# Patient Record
Sex: Male | Born: 1945 | Race: White | Hispanic: No | Marital: Married | Smoking: Current every day smoker
Health system: Southern US, Community
[De-identification: ages and names within clinical notes are randomized; demographics above are authoritative.]

---

## 2014-04-25 ENCOUNTER — Ambulatory Visit: Payer: Self-pay | Admitting: Sports Medicine

## 2014-05-11 ENCOUNTER — Ambulatory Visit (INDEPENDENT_AMBULATORY_CARE_PROVIDER_SITE_OTHER): Payer: Medicare Other | Admitting: Sports Medicine

## 2014-05-11 ENCOUNTER — Encounter: Payer: Self-pay | Admitting: Sports Medicine

## 2014-05-11 ENCOUNTER — Encounter: Payer: Self-pay | Admitting: Neurology

## 2014-05-11 ENCOUNTER — Ambulatory Visit (INDEPENDENT_AMBULATORY_CARE_PROVIDER_SITE_OTHER): Payer: Medicare Other

## 2014-05-11 VITALS — BP 122/75 | HR 70 | Ht 73.0 in | Wt 195.0 lb

## 2014-05-11 DIAGNOSIS — M170 Bilateral primary osteoarthritis of knee: Secondary | ICD-10-CM | POA: Insufficient documentation

## 2014-05-11 DIAGNOSIS — N529 Male erectile dysfunction, unspecified: Secondary | ICD-10-CM | POA: Diagnosis not present

## 2014-05-11 DIAGNOSIS — IMO0002 Reserved for concepts with insufficient information to code with codable children: Secondary | ICD-10-CM | POA: Diagnosis not present

## 2014-05-11 DIAGNOSIS — M1710 Unilateral primary osteoarthritis, unspecified knee: Secondary | ICD-10-CM

## 2014-05-11 DIAGNOSIS — Z299 Encounter for prophylactic measures, unspecified: Secondary | ICD-10-CM | POA: Insufficient documentation

## 2014-05-11 DIAGNOSIS — N528 Other male erectile dysfunction: Secondary | ICD-10-CM

## 2014-05-11 DIAGNOSIS — M11869 Other specified crystal arthropathies, unspecified knee: Secondary | ICD-10-CM

## 2014-05-11 DIAGNOSIS — R259 Unspecified abnormal involuntary movements: Secondary | ICD-10-CM

## 2014-05-11 DIAGNOSIS — Z79899 Other long term (current) drug therapy: Secondary | ICD-10-CM

## 2014-05-11 DIAGNOSIS — G20 Parkinson's disease: Secondary | ICD-10-CM | POA: Insufficient documentation

## 2014-05-11 DIAGNOSIS — G20A1 Parkinson's disease without dyskinesia, without mention of fluctuations: Secondary | ICD-10-CM | POA: Diagnosis not present

## 2014-05-11 DIAGNOSIS — N139 Obstructive and reflux uropathy, unspecified: Secondary | ICD-10-CM

## 2014-05-11 LAB — COMPREHENSIVE METABOLIC PANEL
AST: 17 U/L (ref 0–37)
BUN: 17 mg/dL (ref 6–23)
CO2: 28 mEq/L (ref 19–32)
Calcium: 9.3 mg/dL (ref 8.4–10.5)
Chloride: 104 mEq/L (ref 96–112)
Creat: 0.99 mg/dL (ref 0.50–1.35)
Total Bilirubin: 0.5 mg/dL (ref 0.2–1.2)
Total Protein: 6.9 g/dL (ref 6.0–8.3)

## 2014-05-11 LAB — CBC
HCT: 41.3 % (ref 39.0–52.0)
Hemoglobin: 14.7 g/dL (ref 13.0–17.0)
MCH: 32.2 pg (ref 26.0–34.0)
MCHC: 35.6 g/dL (ref 30.0–36.0)
MCV: 90.6 fL (ref 78.0–100.0)
Platelets: 180 10*3/uL (ref 150–400)
RBC: 4.56 MIL/uL (ref 4.22–5.81)
RDW: 13.3 % (ref 11.5–15.5)
WBC: 6.1 10*3/uL (ref 4.0–10.5)

## 2014-05-11 LAB — COMPREHENSIVE METABOLIC PANEL WITH GFR
ALT: <8 U/L (ref 0–53)
Albumin: 4.1 g/dL (ref 3.5–5.2)
Alkaline Phosphatase: 59 U/L (ref 39–117)
Glucose, Bld: 93 mg/dL (ref 70–99)
Potassium: 4.5 meq/L (ref 3.5–5.3)
Sodium: 140 meq/L (ref 135–145)

## 2014-05-11 LAB — HEMOGLOBIN A1C
Hgb A1c MFr Bld: 5.7 % — ABNORMAL HIGH (ref <5.7)
Mean Plasma Glucose: 117 mg/dL — ABNORMAL HIGH (ref <117)

## 2014-05-11 LAB — URIC ACID: Uric Acid, Serum: 4.6 mg/dL (ref 4.0–7.8)

## 2014-05-11 MED ORDER — CARBIDOPA-LEVODOPA 25-100 MG PO TABS: 1.0000 | ORAL_TABLET | Freq: Three times a day (TID) | ORAL | Status: DC

## 2014-05-11 MED ORDER — TAMSULOSIN HCL 0.4 MG PO CAPS: 0.4000 mg | ORAL_CAPSULE | Freq: Every day | ORAL | Status: DC

## 2014-05-11 MED ORDER — CARBIDOPA-LEVODOPA ER 50-200 MG PO TBCR: 1.0000 | EXTENDED_RELEASE_TABLET | Freq: Two times a day (BID) | ORAL | Status: DC

## 2014-05-11 MED ORDER — ENTACAPONE 200 MG PO TABS: 200.0000 mg | ORAL_TABLET | Freq: Three times a day (TID) | ORAL | Status: DC

## 2014-05-11 MED ORDER — RASAGILINE MESYLATE 1 MG PO TABS: 1.0000 mg | ORAL_TABLET | Freq: Every day | ORAL | Status: DC

## 2014-05-11 MED ORDER — AVANAFIL 100 MG PO TABS: ORAL_TABLET | ORAL | Status: DC

## 2014-05-11 MED ORDER — ROPINIROLE HCL ER 8 MG PO TB24: 8.0000 mg | ORAL_TABLET | Freq: Every day | ORAL | Status: DC

## 2014-05-11 MED ORDER — MELOXICAM 15 MG PO TABS: ORAL_TABLET | ORAL | Status: DC

## 2014-05-11 NOTE — Assessment & Plan Note (Signed)
Stendra samples given. He has already failed Cialis, Levitra, and Viagra. Next step would be intracavernosal prostaglandin injections.

## 2014-05-11 NOTE — Assessment & Plan Note (Signed)
No response to oxybutynin or doxazosin. Switching to Flomax.

## 2014-05-11 NOTE — Assessment & Plan Note (Signed)
Referral to neurology and refilling all medications per patient request.

## 2014-05-11 NOTE — Progress Notes (Signed)
  Subjective:    CC: Establish care.   HPI:  Parkinson's disease: Well controlled on current medications. Needs a refill on everything.   Obstructive uropathy colon was placed on oxybutynin and doxazosin by her prior physician, patient has not noticed any improvement.  Osteoarthritis of both knees: Left worse than right, pain is at the joint lines, moderate, persistent. Not taking any NSAIDs.   Erectile dysfunction: Has failed Viagra, Levitra, Cialis. Has never had testosterone levels checked.  Preventive measure: Up-to-date on all screening measures.  Past medical history, Surgical history, Family history not pertinant except as noted below, Social history, Allergies, and medications have been entered into the medical record, reviewed, and no changes needed.   Review of Systems: No headache, visual changes, nausea, vomiting, diarrhea, constipation, dizziness, abdominal pain, skin rash, fevers, chills, night sweats, swollen lymph nodes, weight loss, chest pain, body aches, joint swelling, muscle aches, shortness of breath, mood changes, visual or auditory hallucinations.  Objective:    General: Well Developed, well nourished, and in no acute distress.  Neuro: Alert and oriented x3, extra-ocular muscles intact, sensation grossly intact.  HEENT: Normocephalic, atraumatic, pupils equal round reactive to light, neck supple, no masses, no lymphadenopathy, thyroid nonpalpable.  Skin: Warm and dry, no rashes noted.  Cardiac: Regular rate and rhythm, no murmurs rubs or gallops.  Respiratory: Clear to auscultation bilaterally. Not using accessory muscles, speaking in full sentences.  Abdominal: Soft, nontender, nondistended, positive bowel sounds, no masses, no organomegaly.  Musculoskeletal: Shoulder, elbow, wrist, hip, knee, ankle stable, and with full range of motion.  Impression and Recommendations:    The patient was counselled, risk factors were discussed, anticipatory guidance  given.

## 2014-05-11 NOTE — Assessment & Plan Note (Signed)
X-rays, information given on viscous supplementation, adding meloxicam.

## 2014-05-11 NOTE — Assessment & Plan Note (Signed)
Colonoscopy was done 2 years ago.

## 2014-05-12 LAB — TSH: TSH: 0.991 u[IU]/mL (ref 0.350–4.500)

## 2014-05-12 LAB — TESTOSTERONE: Testosterone: 544 ng/dL (ref 300–890)

## 2014-05-17 ENCOUNTER — Ambulatory Visit (INDEPENDENT_AMBULATORY_CARE_PROVIDER_SITE_OTHER): Payer: Medicare Other | Admitting: Sports Medicine

## 2014-05-17 ENCOUNTER — Encounter: Payer: Self-pay | Admitting: Sports Medicine

## 2014-05-17 VITALS — BP 98/60 | HR 62 | Ht 73.0 in | Wt 191.0 lb

## 2014-05-17 DIAGNOSIS — K5900 Constipation, unspecified: Secondary | ICD-10-CM | POA: Insufficient documentation

## 2014-05-17 DIAGNOSIS — N529 Male erectile dysfunction, unspecified: Secondary | ICD-10-CM

## 2014-05-17 DIAGNOSIS — G20 Parkinson's disease: Secondary | ICD-10-CM

## 2014-05-17 DIAGNOSIS — N139 Obstructive and reflux uropathy, unspecified: Secondary | ICD-10-CM | POA: Diagnosis not present

## 2014-05-17 DIAGNOSIS — G20A1 Parkinson's disease without dyskinesia, without mention of fluctuations: Secondary | ICD-10-CM

## 2014-05-17 DIAGNOSIS — N528 Other male erectile dysfunction: Secondary | ICD-10-CM

## 2014-05-17 MED ORDER — CARBIDOPA-LEVODOPA 25-100 MG PO TABS: 1.5000 | ORAL_TABLET | Freq: Four times a day (QID) | ORAL | Status: DC

## 2014-05-17 MED ORDER — AVANAFIL 100 MG PO TABS: ORAL_TABLET | ORAL | Status: DC

## 2014-05-17 MED ORDER — AVANAFIL 200 MG PO TABS: ORAL_TABLET | ORAL | Status: DC

## 2014-05-17 MED ORDER — SENNOSIDES-DOCUSATE SODIUM 8.6-50 MG PO TABS: 2.0000 | ORAL_TABLET | Freq: Two times a day (BID) | ORAL | Status: DC

## 2014-05-17 MED ORDER — POLYETHYLENE GLYCOL 3350 17 G PO PACK: 17.0000 g | PACK | Freq: Two times a day (BID) | ORAL | Status: DC

## 2014-05-17 MED ORDER — ENTACAPONE 200 MG PO TABS: 200.0000 mg | ORAL_TABLET | Freq: Four times a day (QID) | ORAL | Status: DC

## 2014-05-17 MED ORDER — CARBIDOPA-LEVODOPA ER 50-200 MG PO TBCR: 1.0000 | EXTENDED_RELEASE_TABLET | Freq: Every day | ORAL | Status: DC

## 2014-05-17 NOTE — Assessment & Plan Note (Signed)
Medication frequency was entered in wrong. Corrected with patient and new rx sent to pharmacy. Return in 2 weeks, be sure to see Neurologist.

## 2014-05-17 NOTE — Assessment & Plan Note (Signed)
Improved with Rayfield Citizen. May take 200 mg.

## 2014-05-17 NOTE — Assessment & Plan Note (Signed)
MiraLax and Senokot-S.

## 2014-05-17 NOTE — Assessment & Plan Note (Signed)
Improved significantly with discontinuing oxybutynin and doxazosin and switching to Flomax.

## 2014-05-17 NOTE — Progress Notes (Addendum)
  Subjective:    CC: problem with medications  HPI: Parkinson's disease: It seems as though the dosage/frequency of medications was entered wrong, he has been taking his Sinemet 1.5 tabs 3 times a day, and an extended release tab at night, the new orders stated one tab 3 times a day, unfortunately he has been experiencing increased tremor and bradykinesia. He has not yet seen his neurologist.  Erectile dysfunction: Improved significantly on 100 mg of Stendra.  Obstructive uropathy: Essentially resolved with starting Flomax and discontinuing doxazosin and oxybutynin.  Osteoarthritis of both knees: Desires to start viscous supplementation but agrees to wait until his Parkinson's disease is under better control.  Constipation: Desires treatment. This is intermittent, he has never had it before.  Past medical history, Surgical history, Family history not pertinant except as noted below, Social history, Allergies, and medications have been entered into the medical record, reviewed, and no changes needed.   Review of Systems: No fevers, chills, night sweats, weight loss, chest pain, or shortness of breath.   Objective:    General: Well Developed, well nourished, and in no acute distress.  Neuro: Alert and oriented x3, extra-ocular muscles intact, sensation grossly intact. Significant masklike facies, pill-rolling tremor and bradykinesia. HEENT: Normocephalic, atraumatic, pupils equal round reactive to light, neck supple, no masses, no lymphadenopathy, thyroid nonpalpable.  Skin: Warm and dry, no rashes. Cardiac: Regular rate and rhythm, no murmurs rubs or gallops, no lower extremity edema.  Respiratory: Clear to auscultation bilaterally. Not using accessory muscles, speaking in full sentences.  Impression and Recommendations:    I spent 40 minutes with this patient, greater than 50% was face-to-face time counseling regarding the multiple below diagnoses, and describing appropriate use of  medications.

## 2014-05-31 ENCOUNTER — Encounter: Payer: Self-pay | Admitting: Sports Medicine

## 2014-05-31 ENCOUNTER — Ambulatory Visit (INDEPENDENT_AMBULATORY_CARE_PROVIDER_SITE_OTHER): Payer: Medicare Other | Admitting: Sports Medicine

## 2014-05-31 VITALS — BP 135/76 | HR 67 | Ht 73.0 in | Wt 189.0 lb

## 2014-05-31 DIAGNOSIS — G20A1 Parkinson's disease without dyskinesia, without mention of fluctuations: Secondary | ICD-10-CM

## 2014-05-31 DIAGNOSIS — M170 Bilateral primary osteoarthritis of knee: Secondary | ICD-10-CM

## 2014-05-31 DIAGNOSIS — N529 Male erectile dysfunction, unspecified: Secondary | ICD-10-CM | POA: Diagnosis not present

## 2014-05-31 DIAGNOSIS — M1710 Unilateral primary osteoarthritis, unspecified knee: Secondary | ICD-10-CM

## 2014-05-31 DIAGNOSIS — N139 Obstructive and reflux uropathy, unspecified: Secondary | ICD-10-CM

## 2014-05-31 DIAGNOSIS — N528 Other male erectile dysfunction: Secondary | ICD-10-CM

## 2014-05-31 DIAGNOSIS — G20 Parkinson's disease: Secondary | ICD-10-CM

## 2014-05-31 MED ORDER — TAMSULOSIN HCL 0.4 MG PO CAPS: 0.4000 mg | ORAL_CAPSULE | Freq: Every day | ORAL | Status: DC

## 2014-05-31 MED ORDER — CARBIDOPA-LEVODOPA 25-100 MG PO TABS: 1.5000 | ORAL_TABLET | Freq: Four times a day (QID) | ORAL | Status: AC

## 2014-05-31 MED ORDER — RASAGILINE MESYLATE 1 MG PO TABS: 1.0000 mg | ORAL_TABLET | Freq: Every day | ORAL | Status: DC

## 2014-05-31 MED ORDER — ENTACAPONE 200 MG PO TABS: 200.0000 mg | ORAL_TABLET | Freq: Four times a day (QID) | ORAL | Status: DC

## 2014-05-31 MED ORDER — ROPINIROLE HCL ER 8 MG PO TB24: 8.0000 mg | ORAL_TABLET | Freq: Every day | ORAL | Status: AC

## 2014-05-31 MED ORDER — CARBIDOPA-LEVODOPA ER 50-200 MG PO TBCR: 1.0000 | EXTENDED_RELEASE_TABLET | Freq: Every day | ORAL | Status: AC

## 2014-05-31 NOTE — Progress Notes (Signed)
  Subjective:    CC: Followup  HPI: Parkinson's disease: Has not yet seen neurology, symptoms have improved significantly since increasing his dose of carbidopa levodopa, he is still working with the pharmacist on coordinating his refills.  Erectile dysfunction: Now has an insufficient response with Rayfield Citizen, initially he did well at 200 mg. He has already tried Viagra, Cialis, Levitra.  Osteoarthritis of the knees: Would like to see with this the supplementation on the left side, right knee is asymptomatic.  Obstructive uropathy: Continues to do well with Flomax, we discontinued doxazosin and oxybutynin.  Past medical history, Surgical history, Family history not pertinant except as noted below, Social history, Allergies, and medications have been entered into the medical record, reviewed, and no changes needed.   Review of Systems: No fevers, chills, night sweats, weight loss, chest pain, or shortness of breath.   Objective:    General: Well Developed, well nourished, and in no acute distress.  Neuro: Alert and oriented x3, extra-ocular muscles intact, sensation grossly intact.  HEENT: Normocephalic, atraumatic, pupils equal round reactive to light, neck supple, no masses, no lymphadenopathy, thyroid nonpalpable.  Skin: Warm and dry, no rashes. Cardiac: Regular rate and rhythm, no murmurs rubs or gallops, no lower extremity edema.  Respiratory: Clear to auscultation bilaterally. Not using accessory muscles, speaking in full sentences.  Procedure: Real-time Ultrasound Guided Injection of left knee Device: GE Logiq E  Verbal informed consent obtained.  Time-out conducted.  Noted no overlying erythema, induration, or other signs of local infection.  Skin prepped in a sterile fashion.  Local anesthesia: Topical Ethyl chloride.  With sterile technique and under real time ultrasound guidance:  2 cc kenalog 40, 4 cc lidocaine injected easily into the suprapatellar recess, syringe switched  and 30 mg/2 mL of OrthoVisc (sodium hyaluronate) in a prefilled syringe was injected easily into the knee through a 22-gauge needle. Completed without difficulty  Pain immediately resolved suggesting accurate placement of the medication.  Advised to call if fevers/chills, erythema, induration, drainage, or persistent bleeding.  Images permanently stored and available for review in the ultrasound unit.  Impression: Technically successful ultrasound guided injection.  Impression and Recommendations:

## 2014-05-31 NOTE — Assessment & Plan Note (Signed)
Improved with increasing carbidopa levodopa dose. He is still waiting on neurology referral.

## 2014-05-31 NOTE — Assessment & Plan Note (Signed)
No improvement with Viagra, Cialis, Levitra, Ray[place] Citizen only worked partially. At this point we are going to refer him to urology for consideration of intracavernosal prostaglandins.

## 2014-05-31 NOTE — Assessment & Plan Note (Signed)
Starting Visco supplementation. Steroid and OrthoVisc into the left knee. Return in one week for injection # 2 of 4.

## 2014-05-31 NOTE — Assessment & Plan Note (Signed)
Excellent response to Flomax.

## 2014-06-07 ENCOUNTER — Encounter: Payer: Self-pay | Admitting: Sports Medicine

## 2014-06-07 ENCOUNTER — Ambulatory Visit (INDEPENDENT_AMBULATORY_CARE_PROVIDER_SITE_OTHER): Payer: Medicare Other | Admitting: Sports Medicine

## 2014-06-07 VITALS — BP 96/56 | HR 80 | Ht 72.0 in | Wt 180.0 lb

## 2014-06-07 DIAGNOSIS — M1710 Unilateral primary osteoarthritis, unspecified knee: Secondary | ICD-10-CM | POA: Diagnosis not present

## 2014-06-07 DIAGNOSIS — M170 Bilateral primary osteoarthritis of knee: Secondary | ICD-10-CM

## 2014-06-07 NOTE — Assessment & Plan Note (Signed)
Right knee continues to be pain free. Left knee is completely pain-free, OrthoVisc injection #2 of 4 given today. Return in one week for #3

## 2014-06-07 NOTE — Progress Notes (Signed)

## 2014-06-08 ENCOUNTER — Ambulatory Visit: Payer: No Typology Code available for payment source | Admitting: Sports Medicine

## 2014-06-14 ENCOUNTER — Encounter: Payer: Self-pay | Admitting: Sports Medicine

## 2014-06-14 ENCOUNTER — Ambulatory Visit (INDEPENDENT_AMBULATORY_CARE_PROVIDER_SITE_OTHER): Payer: Medicare Other | Admitting: Sports Medicine

## 2014-06-14 VITALS — BP 108/66 | HR 76 | Ht 72.0 in | Wt 189.0 lb

## 2014-06-14 DIAGNOSIS — M1710 Unilateral primary osteoarthritis, unspecified knee: Secondary | ICD-10-CM

## 2014-06-14 DIAGNOSIS — M170 Bilateral primary osteoarthritis of knee: Secondary | ICD-10-CM

## 2014-06-14 NOTE — Progress Notes (Signed)

## 2014-06-14 NOTE — Assessment & Plan Note (Addendum)
OrthoVisc injection #3 into the left knee. Return in one week for #4 and custom orthotics, I am happy to try to do this in the same visit.

## 2014-06-21 ENCOUNTER — Ambulatory Visit (INDEPENDENT_AMBULATORY_CARE_PROVIDER_SITE_OTHER): Payer: Medicare Other | Admitting: Sports Medicine

## 2014-06-21 ENCOUNTER — Encounter: Payer: Medicare Other | Admitting: Sports Medicine

## 2014-06-21 ENCOUNTER — Encounter: Payer: Self-pay | Admitting: Sports Medicine

## 2014-06-21 ENCOUNTER — Ambulatory Visit: Payer: Medicare Other | Admitting: Sports Medicine

## 2014-06-21 VITALS — BP 99/63 | HR 79 | Wt 194.0 lb

## 2014-06-21 DIAGNOSIS — M170 Bilateral primary osteoarthritis of knee: Secondary | ICD-10-CM

## 2014-06-21 DIAGNOSIS — M1710 Unilateral primary osteoarthritis, unspecified knee: Secondary | ICD-10-CM

## 2014-06-21 NOTE — Assessment & Plan Note (Signed)
OrthoVisc injection #4 into the left knee, custom orthotics as above. Return in 3 months, I am happy to do OrthoVisc on the right knee if desired, and I'm happy to build him another set of orthotics if he desires.

## 2014-06-21 NOTE — Progress Notes (Signed)

## 2014-06-22 DIAGNOSIS — N401 Enlarged prostate with lower urinary tract symptoms: Secondary | ICD-10-CM | POA: Diagnosis not present

## 2014-06-22 DIAGNOSIS — N139 Obstructive and reflux uropathy, unspecified: Secondary | ICD-10-CM | POA: Diagnosis not present

## 2014-06-22 DIAGNOSIS — N529 Male erectile dysfunction, unspecified: Secondary | ICD-10-CM | POA: Diagnosis not present

## 2014-07-08 ENCOUNTER — Encounter: Payer: Self-pay | Admitting: Sports Medicine

## 2014-07-28 DIAGNOSIS — N529 Male erectile dysfunction, unspecified: Secondary | ICD-10-CM | POA: Diagnosis not present

## 2014-08-28 DIAGNOSIS — R350 Frequency of micturition: Secondary | ICD-10-CM | POA: Diagnosis not present

## 2014-08-28 DIAGNOSIS — N529 Male erectile dysfunction, unspecified: Secondary | ICD-10-CM | POA: Diagnosis not present

## 2014-08-28 DIAGNOSIS — N401 Enlarged prostate with lower urinary tract symptoms: Secondary | ICD-10-CM | POA: Diagnosis not present

## 2014-08-28 DIAGNOSIS — R351 Nocturia: Secondary | ICD-10-CM | POA: Diagnosis not present

## 2014-09-20 ENCOUNTER — Encounter: Payer: Self-pay | Admitting: Sports Medicine

## 2014-09-20 ENCOUNTER — Ambulatory Visit (INDEPENDENT_AMBULATORY_CARE_PROVIDER_SITE_OTHER): Payer: Medicare Other | Admitting: Sports Medicine

## 2014-09-20 VITALS — BP 134/82 | HR 79 | Wt 204.0 lb

## 2014-09-20 DIAGNOSIS — M170 Bilateral primary osteoarthritis of knee: Secondary | ICD-10-CM

## 2014-09-20 DIAGNOSIS — Z418 Encounter for other procedures for purposes other than remedying health state: Secondary | ICD-10-CM | POA: Diagnosis not present

## 2014-09-20 DIAGNOSIS — G20 Parkinson's disease: Secondary | ICD-10-CM

## 2014-09-20 DIAGNOSIS — G20A1 Parkinson's disease without dyskinesia, without mention of fluctuations: Secondary | ICD-10-CM

## 2014-09-20 DIAGNOSIS — Z299 Encounter for prophylactic measures, unspecified: Secondary | ICD-10-CM

## 2014-09-20 NOTE — Assessment & Plan Note (Signed)
Up-to-date, declines influenza and shingles vaccination.

## 2014-09-20 NOTE — Progress Notes (Signed)
  Subjective:    CC: follow-up  HPI: Bilateral knee osteoarthritis: Pain resolved after we finished Visco supplementation approximately 3 months ago.  Obstructive uropathy: Doing well.  Parkinson's disease: Stable on current medications and followed by neurology.  Preventive measures: Up-to-date on most, declines influenza and shingles vaccination.  Past medical history, Surgical history, Family history not pertinant except as noted below, Social history, Allergies, and medications have been entered into the medical record, reviewed, and no changes needed.   Review of Systems: No fevers, chills, night sweats, weight loss, chest pain, or shortness of breath.   Objective:    General: Well Developed, well nourished, and in no acute distress.  Neuro: Alert and oriented x3, extra-ocular muscles intact, sensation grossly intact.  HEENT: Normocephalic, atraumatic, pupils equal round reactive to light, neck supple, no masses, no lymphadenopathy, thyroid nonpalpable.  Skin: Warm and dry, no rashes. Cardiac: Regular rate and rhythm, no murmurs rubs or gallops, no lower extremity edema.  Respiratory: Clear to auscultation bilaterally. Not using accessory muscles, speaking in full sentences.  Impression and Recommendations:

## 2014-09-20 NOTE — Assessment & Plan Note (Signed)
Continues to be completely pain-free 3 months after finishing Orthovisc.

## 2014-09-20 NOTE — Assessment & Plan Note (Signed)
Well controlled and managed by his neurologist.

## 2014-11-02 DIAGNOSIS — G20 Parkinson's disease: Secondary | ICD-10-CM | POA: Diagnosis not present

## 2015-03-22 ENCOUNTER — Encounter: Payer: Self-pay | Admitting: Sports Medicine

## 2015-03-22 ENCOUNTER — Ambulatory Visit (INDEPENDENT_AMBULATORY_CARE_PROVIDER_SITE_OTHER): Payer: Medicare Other

## 2015-03-22 ENCOUNTER — Ambulatory Visit (INDEPENDENT_AMBULATORY_CARE_PROVIDER_SITE_OTHER): Payer: Medicare Other | Admitting: Sports Medicine

## 2015-03-22 VITALS — BP 99/62 | HR 81 | Wt 204.0 lb

## 2015-03-22 DIAGNOSIS — K5900 Constipation, unspecified: Secondary | ICD-10-CM | POA: Diagnosis not present

## 2015-03-22 DIAGNOSIS — M7071 Other bursitis of hip, right hip: Secondary | ICD-10-CM

## 2015-03-22 DIAGNOSIS — M5136 Other intervertebral disc degeneration, lumbar region: Secondary | ICD-10-CM

## 2015-03-22 DIAGNOSIS — R252 Cramp and spasm: Secondary | ICD-10-CM | POA: Insufficient documentation

## 2015-03-22 DIAGNOSIS — K5904 Chronic idiopathic constipation: Secondary | ICD-10-CM | POA: Insufficient documentation

## 2015-03-22 DIAGNOSIS — M1611 Unilateral primary osteoarthritis, right hip: Secondary | ICD-10-CM | POA: Diagnosis not present

## 2015-03-22 MED ORDER — MAGNESIUM OXIDE 400 MG PO TABS: 800.0000 mg | ORAL_TABLET | Freq: Every day | ORAL | Status: AC

## 2015-03-22 MED ORDER — LINACLOTIDE 145 MCG PO CAPS: 145.0000 ug | ORAL_CAPSULE | Freq: Every day | ORAL | Status: DC

## 2015-03-22 NOTE — Assessment & Plan Note (Signed)
Linzess and a discount coupon given.

## 2015-03-22 NOTE — Assessment & Plan Note (Signed)
Home rehabilitation exercises, x-rays. Return in 4 weeks, ischial bursa injection if no better.

## 2015-03-22 NOTE — Progress Notes (Signed)
  Subjective:    CC:  Follow-up  HPI: Parkinson's disease: Having some off symptoms, has a follow-up with neurology coming up.  Nocturnal muscle cramps: Lower extremity, bilateral. Wonders what can be done.  Buttock pain: Right-sided, occurred after a fall 6 months ago. Nothing radicular, pain over the ischial tuberosity.  Past medical history, Surgical history, Family history not pertinant except as noted below, Social history, Allergies, and medications have been entered into the medical record, reviewed, and no changes needed.   Review of Systems: No fevers, chills, night sweats, weight loss, chest pain, or shortness of breath.   Objective:    General: Well Developed, well nourished, and in no acute distress.  Neuro: Alert and oriented x3, extra-ocular muscles intact, sensation grossly intact.  HEENT: Normocephalic, atraumatic, pupils equal round reactive to light, neck supple, no masses, no lymphadenopathy, thyroid nonpalpable.  Skin: Warm and dry, no rashes. Cardiac: Regular rate and rhythm, no murmurs rubs or gallops, no lower extremity edema.  Respiratory: Clear to auscultation bilaterally. Not using accessory muscles, speaking in full sentences. Right Hip: ROM IR: 60 Deg, ER: 60 Deg, Flexion: 120 Deg, Extension: 100 Deg, Abduction: 45 Deg, Adduction: 45 Deg Strength IR: 5/5, ER: 5/5, Flexion: 5/5, Extension: 5/5, Abduction: 5/5, Adduction: 5/5 Pelvic alignment unremarkable to inspection and palpation. Standing hip rotation and gait without trendelenburg / unsteadiness. Greater trochanter without tenderness to palpation. No tenderness over piriformis. Tender to palpation over the lateral aspect of the ischial tuberosity, no reproduction of pain with resisted hip or knee flexion. No SI joint tenderness and normal minimal SI movement.  Impression and Recommendations:

## 2015-03-22 NOTE — Assessment & Plan Note (Signed)
Magnesium oxide 800 mg at bedtime

## 2015-03-29 DIAGNOSIS — G20 Parkinson's disease: Secondary | ICD-10-CM | POA: Diagnosis not present

## 2015-04-19 ENCOUNTER — Ambulatory Visit (INDEPENDENT_AMBULATORY_CARE_PROVIDER_SITE_OTHER): Payer: Medicare Other | Admitting: Sports Medicine

## 2015-04-19 ENCOUNTER — Encounter: Payer: Self-pay | Admitting: Sports Medicine

## 2015-04-19 VITALS — BP 110/67 | HR 74 | Ht 73.0 in | Wt 186.0 lb

## 2015-04-19 DIAGNOSIS — N5201 Erectile dysfunction due to arterial insufficiency: Secondary | ICD-10-CM

## 2015-04-19 DIAGNOSIS — K5900 Constipation, unspecified: Secondary | ICD-10-CM | POA: Diagnosis not present

## 2015-04-19 DIAGNOSIS — M7071 Other bursitis of hip, right hip: Secondary | ICD-10-CM | POA: Diagnosis not present

## 2015-04-19 DIAGNOSIS — K5904 Chronic idiopathic constipation: Secondary | ICD-10-CM

## 2015-04-19 MED ORDER — POLYETHYLENE GLYCOL 3350 17 G PO PACK: 17.0000 g | PACK | Freq: Two times a day (BID) | ORAL | Status: DC

## 2015-04-19 MED ORDER — SENNOSIDES-DOCUSATE SODIUM 8.6-50 MG PO TABS: 2.0000 | ORAL_TABLET | Freq: Two times a day (BID) | ORAL | Status: AC

## 2015-04-19 MED ORDER — AMBULATORY NON FORMULARY MEDICATION: Status: AC

## 2015-04-19 NOTE — Assessment & Plan Note (Signed)
Resolved rehabilitation exercises.

## 2015-04-19 NOTE — Assessment & Plan Note (Signed)
Insufficient response to 5 phosphodiesterase inhibitors, recently started prostaglandin for cavernosal injections which helped significantly however his Parkinson's disease makes it difficult to aim correctly, and he has had several bruises and missed shots. I'm going to try a prescription for a vacuum device with ring. The next step would be implantable prosthesis.

## 2015-04-19 NOTE — Assessment & Plan Note (Addendum)
Double Linzess to 290 mg daily, adding MiraLAX twice a day and Senokot-S twice a day. If no improvement in 2 weeks we will consider enemas and Amitiza. He did have a recent colonoscopy 2 years ago. No visible obstruction. No visible obstruction.

## 2015-04-19 NOTE — Progress Notes (Signed)
  Subjective:    CC: follow-up  HPI: Erectile dysfunction: No response to 5 phosphodiesterase inhibitors, or intracavernosal prostaglandin injections, unfortunately due to his Parkinson's disease he has missed the target a few times however the injections have given him approximately 2 hour erection. He wonders if a vacuum device would be effective before considering implantable prosthesis.  Constipation: No response to low-dose Linzess.  Right ischial bursitis: Resolved  Past medical history, Surgical history, Family history not pertinant except as noted below, Social history, Allergies, and medications have been entered into the medical record, reviewed, and no changes needed.   Review of Systems: No fevers, chills, night sweats, weight loss, chest pain, or shortness of breath.   Objective:    General: Well Developed, well nourished, and in no acute distress.  Neuro: Alert and oriented x3, extra-ocular muscles intact, sensation grossly intact.  HEENT: Normocephalic, atraumatic, pupils equal round reactive to light, neck supple, no masses, no lymphadenopathy, thyroid nonpalpable.  Skin: Warm and dry, no rashes. Cardiac: Regular rate and rhythm, no murmurs rubs or gallops, no lower extremity edema.  Respiratory: Clear to auscultation bilaterally. Not using accessory muscles, speaking in full sentences.  Impression and Recommendations:

## 2015-05-03 ENCOUNTER — Encounter: Payer: Self-pay | Admitting: Sports Medicine

## 2015-05-03 ENCOUNTER — Ambulatory Visit (INDEPENDENT_AMBULATORY_CARE_PROVIDER_SITE_OTHER): Payer: Medicare Other | Admitting: Sports Medicine

## 2015-05-03 VITALS — BP 101/70 | HR 77 | Wt 204.0 lb

## 2015-05-03 DIAGNOSIS — K5900 Constipation, unspecified: Secondary | ICD-10-CM | POA: Diagnosis not present

## 2015-05-03 DIAGNOSIS — N5201 Erectile dysfunction due to arterial insufficiency: Secondary | ICD-10-CM | POA: Diagnosis not present

## 2015-05-03 DIAGNOSIS — K5904 Chronic idiopathic constipation: Secondary | ICD-10-CM

## 2015-05-03 MED ORDER — PSYLLIUM 95 % PO PACK: PACK | ORAL | Status: AC

## 2015-05-03 NOTE — Assessment & Plan Note (Signed)
Insufficient response to 5 phosphodiesterase inhibitors, recently started prostaglandin for cavernosal injections which helped significantly however his Parkinson's disease makes it difficult to aim correctly, and he has had several bruises and missed shots. I'm going to try a prescription for a vacuum device with ring. The next step would be implantable prosthesis.  Still has not picked up pump and ring.

## 2015-05-03 NOTE — Assessment & Plan Note (Signed)
Increase Senokot-S to 2 tabs twice a day, MiraLAX to every 6 hours, adding Metamucil. Next step would be home health nursing visit for emenas.

## 2015-05-03 NOTE — Progress Notes (Signed)
  Subjective:    CC: follow-up  HPI: Erectile dysfunction: Has not yet obtained his penis pump and ring  Constipation: Discontinued Linzess, actually had a fairly good response to MiraLAX and Senokot-S.  Past medical history, Surgical history, Family history not pertinant except as noted below, Social history, Allergies, and medications have been entered into the medical record, reviewed, and no changes needed.   Review of Systems: No fevers, chills, night sweats, weight loss, chest pain, or shortness of breath.   Objective:    General: Well Developed, well nourished, and in no acute distress.  Neuro: Alert and oriented x3, extra-ocular muscles intact, sensation grossly intact.  HEENT: Normocephalic, atraumatic, pupils equal round reactive to light, neck supple, no masses, no lymphadenopathy, thyroid nonpalpable.  Skin: Warm and dry, no rashes. Cardiac: Regular rate and rhythm, no murmurs rubs or gallops, no lower extremity edema.  Respiratory: Clear to auscultation bilaterally. Not using accessory muscles, speaking in full sentences. Abdomen: minimally distended, nontender. Good bowel sounds.  Impression and Recommendations:    I spent 25 minutes with this patient, greater than 50% was face-to-face time counseling regarding the above diagnoses

## 2015-05-17 ENCOUNTER — Ambulatory Visit (INDEPENDENT_AMBULATORY_CARE_PROVIDER_SITE_OTHER): Payer: Medicare Other | Admitting: Sports Medicine

## 2015-05-17 VITALS — BP 105/66 | HR 82 | Ht 73.0 in | Wt 204.0 lb

## 2015-05-17 DIAGNOSIS — K5900 Constipation, unspecified: Secondary | ICD-10-CM | POA: Diagnosis not present

## 2015-05-17 DIAGNOSIS — K5904 Chronic idiopathic constipation: Secondary | ICD-10-CM

## 2015-05-17 NOTE — Progress Notes (Signed)
  Subjective:    CC: follow-up  HPI: Constipation: Resolved with combination of MiraLAX, Senokot-S, and Metamucil. Happy with results so far.  Past medical history, Surgical history, Family history not pertinant except as noted below, Social history, Allergies, and medications have been entered into the medical record, reviewed, and no changes needed.   Review of Systems: No fevers, chills, night sweats, weight loss, chest pain, or shortness of breath.   Objective:    General: Well Developed, well nourished, and in no acute distress.  Neuro: Alert and oriented x3, extra-ocular muscles intact, sensation grossly intact.  HEENT: Normocephalic, atraumatic, pupils equal round reactive to light, neck supple, no masses, no lymphadenopathy, thyroid nonpalpable.  Skin: Warm and dry, no rashes. Cardiac: Regular rate and rhythm, no murmurs rubs or gallops, no lower extremity edema.  Respiratory: Clear to auscultation bilaterally. Not using accessory muscles, speaking in full sentences. Abdomen: Soft, nontender, nondistended, normal bowel sounds, no palpable masses.  Impression and Recommendations:

## 2015-05-17 NOTE — Assessment & Plan Note (Signed)
Has finally done well with MiraLAX, Metamucil fiber, and Senokot-S.

## 2015-05-28 ENCOUNTER — Other Ambulatory Visit: Payer: Self-pay | Admitting: Sports Medicine

## 2015-05-28 DIAGNOSIS — N139 Obstructive and reflux uropathy, unspecified: Secondary | ICD-10-CM

## 2015-05-28 MED ORDER — TAMSULOSIN HCL 0.4 MG PO CAPS: 0.4000 mg | ORAL_CAPSULE | Freq: Every day | ORAL | Status: AC

## 2015-06-29 DIAGNOSIS — G20 Parkinson's disease: Secondary | ICD-10-CM | POA: Diagnosis not present

## 2015-07-16 DIAGNOSIS — G20 Parkinson's disease: Secondary | ICD-10-CM | POA: Diagnosis not present

## 2015-08-16 DIAGNOSIS — G20 Parkinson's disease: Secondary | ICD-10-CM | POA: Diagnosis not present

## 2015-08-19 IMAGING — CR DG KNEE COMPLETE 4+V*R*
6 series · 6 of 6 positions shown · non-contrast
Comparison: None.

CLINICAL DATA: Bilateral knee pain and stiffness.

EXAM:
RIGHT KNEE - COMPLETE 4+ VIEW

[view not recorded (1 of 6)]
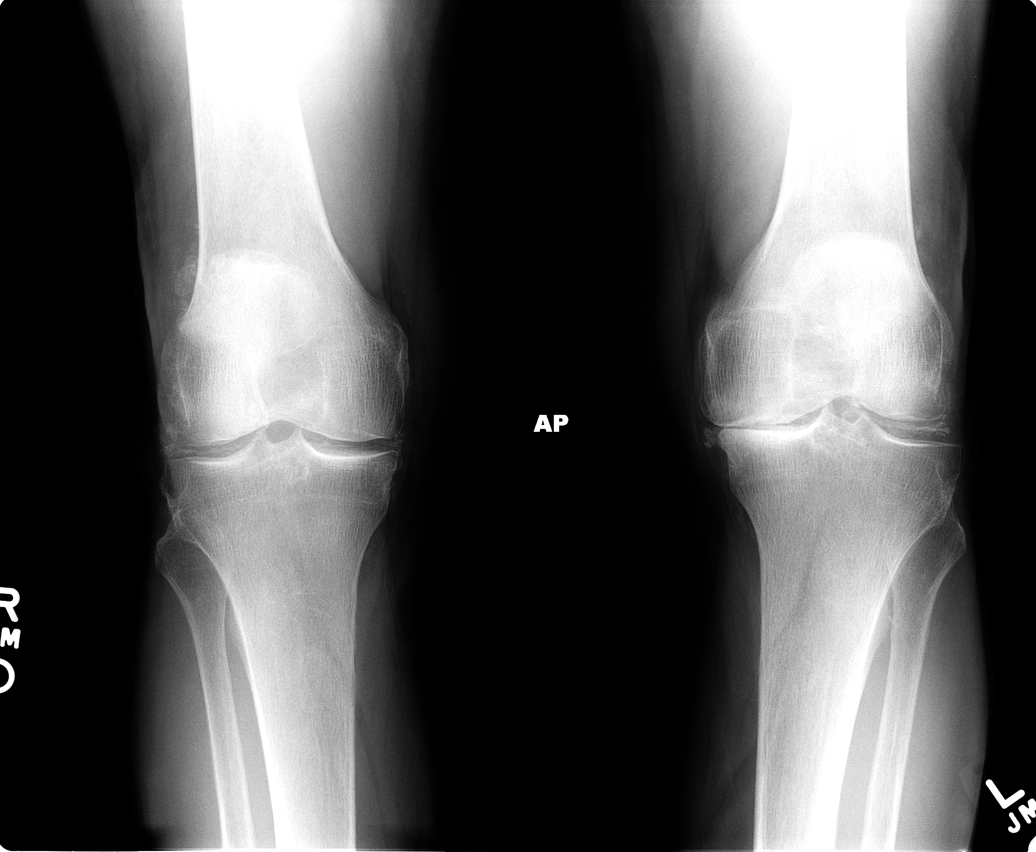

[view not recorded (2 of 6)]
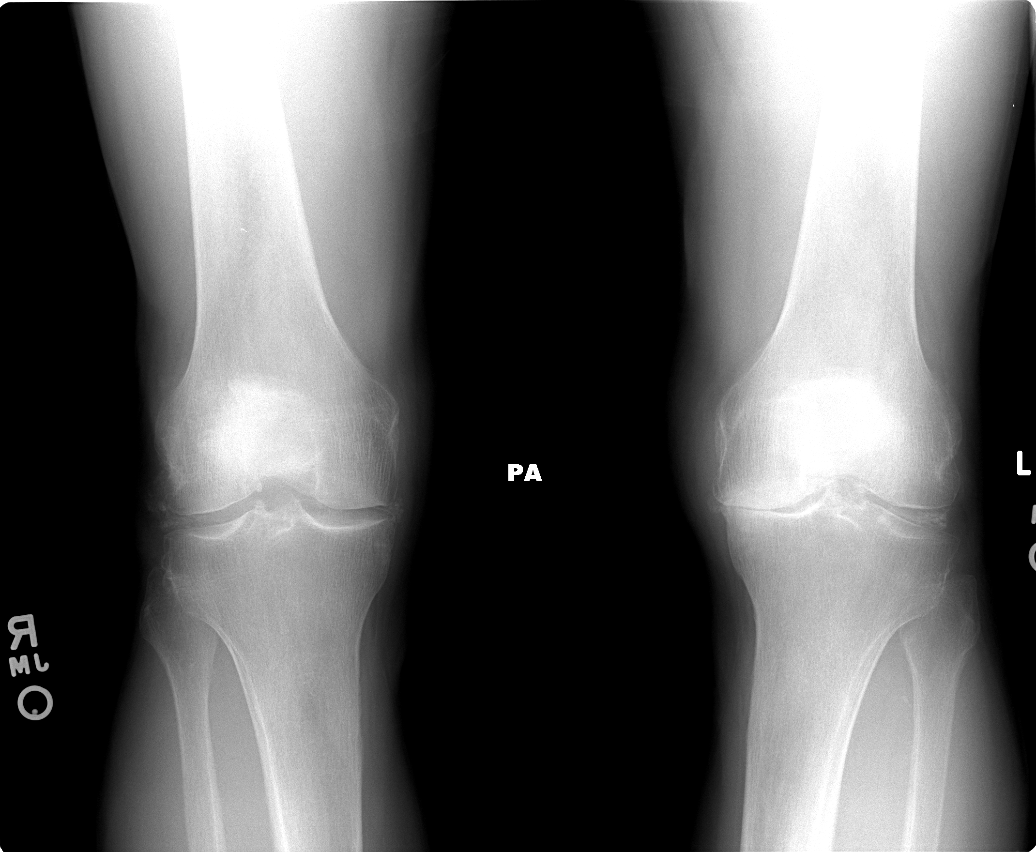

[view not recorded (3 of 6)]
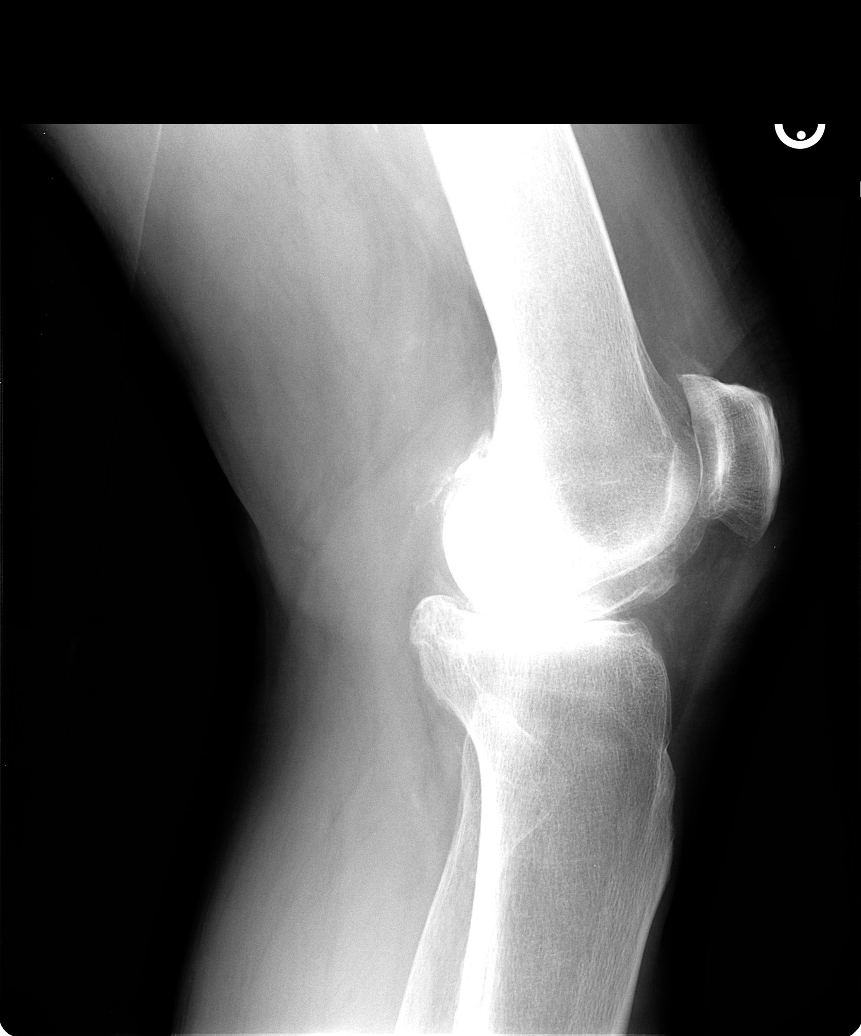

[view not recorded (4 of 6)]
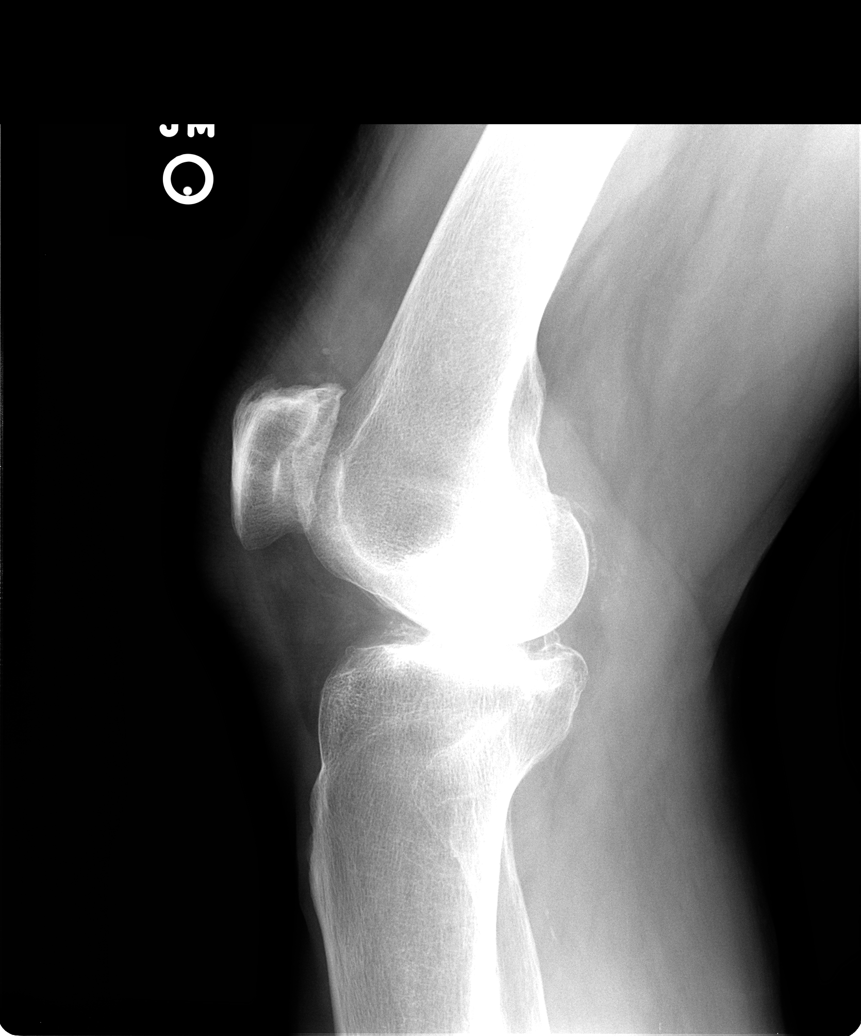

[view not recorded (5 of 6)]
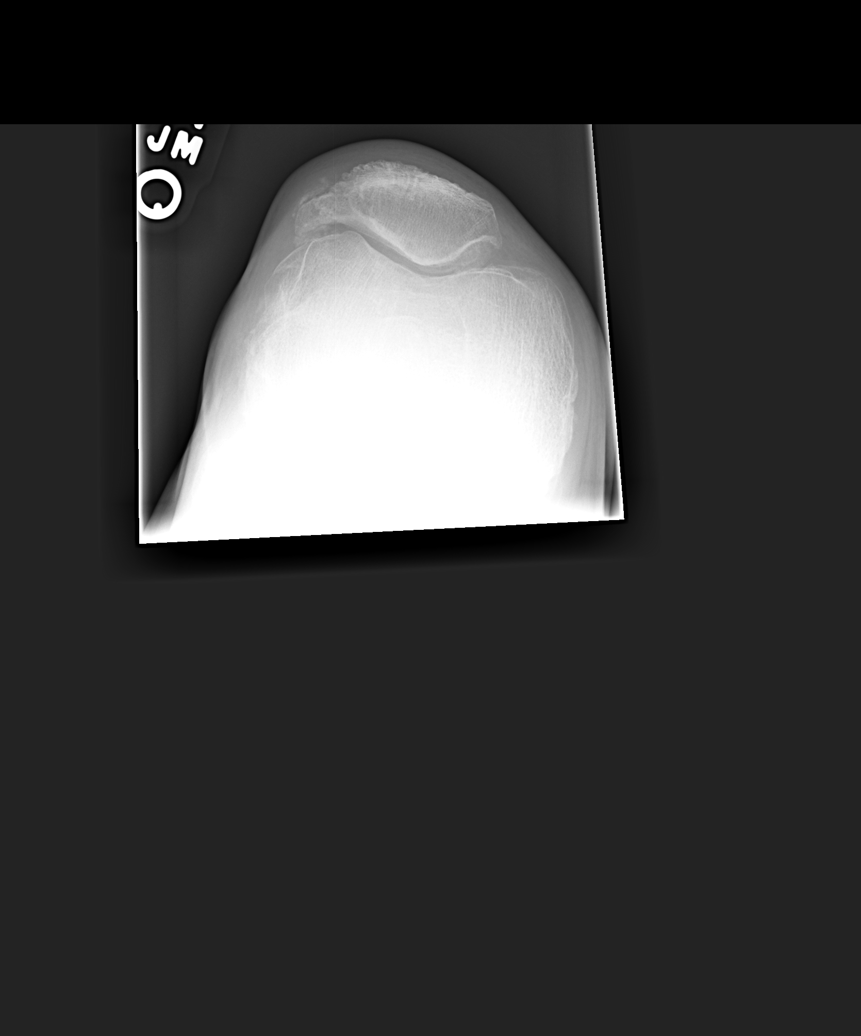

[view not recorded (6 of 6)]
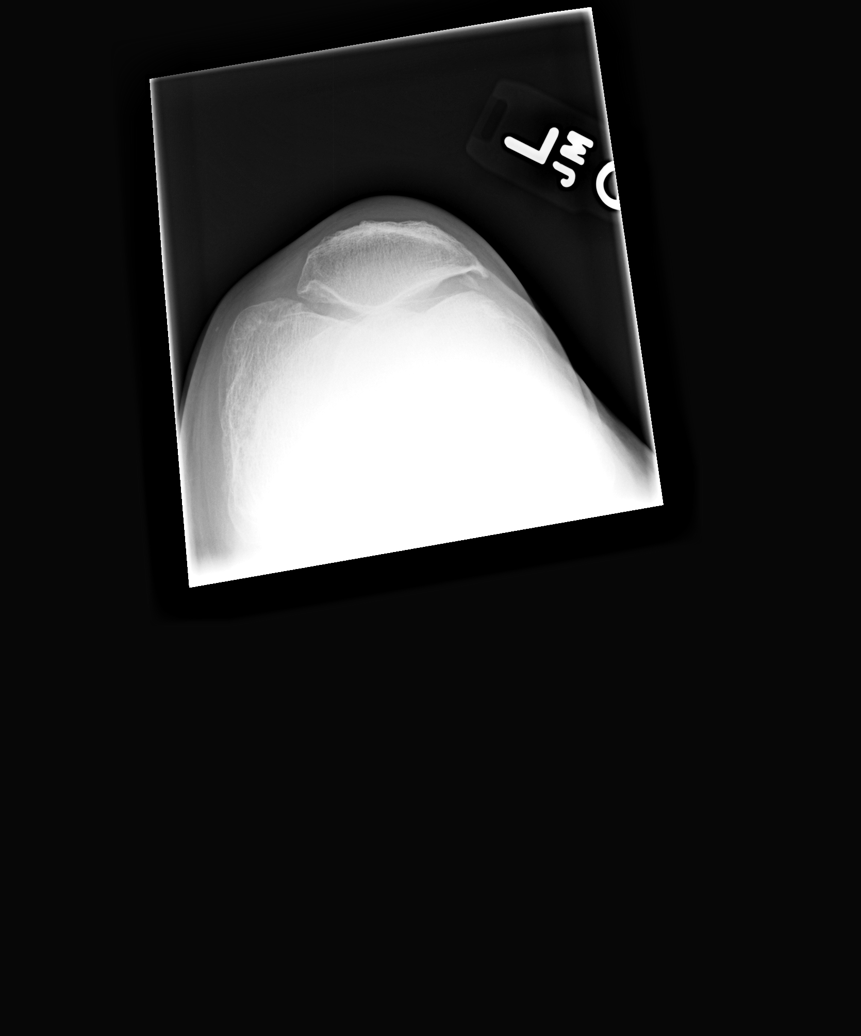

[6 of 6 positions shown; findings below may reference images not displayed]

FINDINGS: Standing AP, lateral, and sunrise views of both knees. The left knee
was dictated separately. Joint space narrowing involves all 3
compartments of the right knee. This is moderate and less severe
than the left. Chondrocalcinosis is identified. No definite joint
effusion.
IMPRESSION: Three compartment right knee osteoarthritis, without joint effusion.

Chondrocalcinosis, consistent with calcium pyrophosphate deposition
disease.

## 2015-09-05 DIAGNOSIS — R2689 Other abnormalities of gait and mobility: Secondary | ICD-10-CM | POA: Diagnosis not present

## 2015-09-05 DIAGNOSIS — G20 Parkinson's disease: Secondary | ICD-10-CM | POA: Diagnosis not present

## 2015-09-05 DIAGNOSIS — M25551 Pain in right hip: Secondary | ICD-10-CM | POA: Diagnosis not present

## 2015-09-05 DIAGNOSIS — M25552 Pain in left hip: Secondary | ICD-10-CM | POA: Diagnosis not present

## 2015-09-13 DIAGNOSIS — G20 Parkinson's disease: Secondary | ICD-10-CM | POA: Diagnosis not present

## 2015-09-13 DIAGNOSIS — R2689 Other abnormalities of gait and mobility: Secondary | ICD-10-CM | POA: Diagnosis not present

## 2015-09-20 DIAGNOSIS — Z23 Encounter for immunization: Secondary | ICD-10-CM | POA: Diagnosis not present

## 2015-09-20 DIAGNOSIS — R269 Unspecified abnormalities of gait and mobility: Secondary | ICD-10-CM | POA: Diagnosis not present

## 2015-09-26 DIAGNOSIS — R269 Unspecified abnormalities of gait and mobility: Secondary | ICD-10-CM | POA: Diagnosis not present

## 2015-10-03 DIAGNOSIS — R269 Unspecified abnormalities of gait and mobility: Secondary | ICD-10-CM | POA: Diagnosis not present

## 2016-04-18 DIAGNOSIS — G20 Parkinson's disease: Secondary | ICD-10-CM | POA: Diagnosis not present

## 2016-04-18 DIAGNOSIS — L219 Seborrheic dermatitis, unspecified: Secondary | ICD-10-CM | POA: Diagnosis not present

## 2016-04-18 DIAGNOSIS — Z79899 Other long term (current) drug therapy: Secondary | ICD-10-CM | POA: Diagnosis not present

## 2016-04-21 DIAGNOSIS — N5201 Erectile dysfunction due to arterial insufficiency: Secondary | ICD-10-CM | POA: Diagnosis not present

## 2016-05-21 ENCOUNTER — Encounter: Payer: Self-pay | Admitting: Sports Medicine

## 2016-06-10 ENCOUNTER — Other Ambulatory Visit: Payer: Self-pay

## 2016-07-21 DIAGNOSIS — F329 Major depressive disorder, single episode, unspecified: Secondary | ICD-10-CM | POA: Diagnosis not present

## 2016-07-21 DIAGNOSIS — G20 Parkinson's disease: Secondary | ICD-10-CM | POA: Diagnosis not present

## 2016-07-21 DIAGNOSIS — Z79899 Other long term (current) drug therapy: Secondary | ICD-10-CM | POA: Diagnosis not present

## 2016-07-21 DIAGNOSIS — R2689 Other abnormalities of gait and mobility: Secondary | ICD-10-CM | POA: Diagnosis not present

## 2016-07-21 DIAGNOSIS — Z7982 Long term (current) use of aspirin: Secondary | ICD-10-CM | POA: Diagnosis not present

## 2016-10-23 DIAGNOSIS — F17210 Nicotine dependence, cigarettes, uncomplicated: Secondary | ICD-10-CM | POA: Diagnosis not present

## 2016-10-23 DIAGNOSIS — R42 Dizziness and giddiness: Secondary | ICD-10-CM | POA: Diagnosis not present

## 2016-10-23 DIAGNOSIS — Z86010 Personal history of colonic polyps: Secondary | ICD-10-CM | POA: Diagnosis not present

## 2016-10-23 DIAGNOSIS — N400 Enlarged prostate without lower urinary tract symptoms: Secondary | ICD-10-CM | POA: Diagnosis not present

## 2016-10-23 DIAGNOSIS — N529 Male erectile dysfunction, unspecified: Secondary | ICD-10-CM | POA: Diagnosis not present

## 2016-10-23 DIAGNOSIS — R350 Frequency of micturition: Secondary | ICD-10-CM | POA: Diagnosis not present

## 2016-10-23 DIAGNOSIS — J449 Chronic obstructive pulmonary disease, unspecified: Secondary | ICD-10-CM | POA: Diagnosis not present

## 2016-10-23 DIAGNOSIS — Z1321 Encounter for screening for nutritional disorder: Secondary | ICD-10-CM | POA: Diagnosis not present

## 2016-10-23 DIAGNOSIS — K5900 Constipation, unspecified: Secondary | ICD-10-CM | POA: Diagnosis not present

## 2016-10-23 DIAGNOSIS — N528 Other male erectile dysfunction: Secondary | ICD-10-CM | POA: Diagnosis not present

## 2016-10-23 DIAGNOSIS — G20 Parkinson's disease: Secondary | ICD-10-CM | POA: Diagnosis not present

## 2016-10-23 DIAGNOSIS — L218 Other seborrheic dermatitis: Secondary | ICD-10-CM | POA: Diagnosis not present

## 2016-10-28 DIAGNOSIS — L821 Other seborrheic keratosis: Secondary | ICD-10-CM | POA: Diagnosis not present

## 2016-10-28 DIAGNOSIS — D229 Melanocytic nevi, unspecified: Secondary | ICD-10-CM | POA: Diagnosis not present

## 2016-10-28 DIAGNOSIS — L814 Other melanin hyperpigmentation: Secondary | ICD-10-CM | POA: Diagnosis not present

## 2016-10-28 DIAGNOSIS — L218 Other seborrheic dermatitis: Secondary | ICD-10-CM | POA: Diagnosis not present

## 2016-10-28 DIAGNOSIS — L820 Inflamed seborrheic keratosis: Secondary | ICD-10-CM | POA: Diagnosis not present

## 2016-10-28 DIAGNOSIS — D485 Neoplasm of uncertain behavior of skin: Secondary | ICD-10-CM | POA: Diagnosis not present

## 2016-11-07 DIAGNOSIS — R42 Dizziness and giddiness: Secondary | ICD-10-CM | POA: Diagnosis not present

## 2016-11-07 DIAGNOSIS — Z13220 Encounter for screening for lipoid disorders: Secondary | ICD-10-CM | POA: Diagnosis not present

## 2016-11-07 DIAGNOSIS — G20 Parkinson's disease: Secondary | ICD-10-CM | POA: Diagnosis not present

## 2016-11-07 DIAGNOSIS — Z720 Tobacco use: Secondary | ICD-10-CM | POA: Diagnosis not present

## 2016-11-07 DIAGNOSIS — J988 Other specified respiratory disorders: Secondary | ICD-10-CM | POA: Diagnosis not present

## 2016-11-07 DIAGNOSIS — K5900 Constipation, unspecified: Secondary | ICD-10-CM | POA: Diagnosis not present

## 2016-11-07 DIAGNOSIS — Z87891 Personal history of nicotine dependence: Secondary | ICD-10-CM | POA: Diagnosis not present

## 2016-11-08 DIAGNOSIS — G4733 Obstructive sleep apnea (adult) (pediatric): Secondary | ICD-10-CM | POA: Diagnosis not present

## 2016-11-09 DIAGNOSIS — G4733 Obstructive sleep apnea (adult) (pediatric): Secondary | ICD-10-CM | POA: Diagnosis not present

## 2016-11-18 DIAGNOSIS — E875 Hyperkalemia: Secondary | ICD-10-CM | POA: Diagnosis not present

## 2016-11-27 DIAGNOSIS — N529 Male erectile dysfunction, unspecified: Secondary | ICD-10-CM | POA: Diagnosis not present

## 2016-11-27 DIAGNOSIS — R42 Dizziness and giddiness: Secondary | ICD-10-CM | POA: Diagnosis not present

## 2016-11-27 DIAGNOSIS — R002 Palpitations: Secondary | ICD-10-CM | POA: Diagnosis not present

## 2016-11-27 DIAGNOSIS — Z1211 Encounter for screening for malignant neoplasm of colon: Secondary | ICD-10-CM | POA: Diagnosis not present

## 2016-11-27 DIAGNOSIS — J449 Chronic obstructive pulmonary disease, unspecified: Secondary | ICD-10-CM | POA: Diagnosis not present

## 2016-11-27 DIAGNOSIS — Z86010 Personal history of colonic polyps: Secondary | ICD-10-CM | POA: Diagnosis not present

## 2016-11-27 DIAGNOSIS — Z1159 Encounter for screening for other viral diseases: Secondary | ICD-10-CM | POA: Diagnosis not present

## 2016-11-27 DIAGNOSIS — Z87438 Personal history of other diseases of male genital organs: Secondary | ICD-10-CM | POA: Diagnosis not present

## 2016-11-28 DIAGNOSIS — G20 Parkinson's disease: Secondary | ICD-10-CM | POA: Diagnosis not present

## 2016-12-01 DIAGNOSIS — Z23 Encounter for immunization: Secondary | ICD-10-CM | POA: Diagnosis not present

## 2016-12-01 DIAGNOSIS — R0683 Snoring: Secondary | ICD-10-CM | POA: Diagnosis not present

## 2016-12-01 DIAGNOSIS — E669 Obesity, unspecified: Secondary | ICD-10-CM | POA: Diagnosis not present

## 2016-12-01 DIAGNOSIS — Z720 Tobacco use: Secondary | ICD-10-CM | POA: Diagnosis not present

## 2016-12-01 DIAGNOSIS — G4733 Obstructive sleep apnea (adult) (pediatric): Secondary | ICD-10-CM | POA: Diagnosis not present

## 2016-12-01 DIAGNOSIS — F17210 Nicotine dependence, cigarettes, uncomplicated: Secondary | ICD-10-CM | POA: Diagnosis not present

## 2016-12-01 DIAGNOSIS — R0609 Other forms of dyspnea: Secondary | ICD-10-CM | POA: Diagnosis not present

## 2016-12-01 DIAGNOSIS — G20 Parkinson's disease: Secondary | ICD-10-CM | POA: Diagnosis not present

## 2016-12-05 DIAGNOSIS — Z125 Encounter for screening for malignant neoplasm of prostate: Secondary | ICD-10-CM | POA: Diagnosis not present

## 2016-12-05 DIAGNOSIS — Z1159 Encounter for screening for other viral diseases: Secondary | ICD-10-CM | POA: Diagnosis not present

## 2017-01-06 DIAGNOSIS — Z720 Tobacco use: Secondary | ICD-10-CM | POA: Diagnosis not present

## 2017-01-06 DIAGNOSIS — E669 Obesity, unspecified: Secondary | ICD-10-CM | POA: Diagnosis not present

## 2017-01-06 DIAGNOSIS — G20 Parkinson's disease: Secondary | ICD-10-CM | POA: Diagnosis not present

## 2017-01-06 DIAGNOSIS — Z8709 Personal history of other diseases of the respiratory system: Secondary | ICD-10-CM | POA: Diagnosis not present

## 2017-01-06 DIAGNOSIS — G4733 Obstructive sleep apnea (adult) (pediatric): Secondary | ICD-10-CM | POA: Diagnosis not present

## 2017-01-06 DIAGNOSIS — R938 Abnormal findings on diagnostic imaging of other specified body structures: Secondary | ICD-10-CM | POA: Diagnosis not present

## 2017-01-06 DIAGNOSIS — R0609 Other forms of dyspnea: Secondary | ICD-10-CM | POA: Diagnosis not present

## 2017-01-06 DIAGNOSIS — J432 Centrilobular emphysema: Secondary | ICD-10-CM | POA: Diagnosis not present

## 2017-01-06 DIAGNOSIS — R0683 Snoring: Secondary | ICD-10-CM | POA: Diagnosis not present

## 2017-01-29 DIAGNOSIS — Z1211 Encounter for screening for malignant neoplasm of colon: Secondary | ICD-10-CM | POA: Diagnosis not present

## 2017-01-29 DIAGNOSIS — Z01818 Encounter for other preprocedural examination: Secondary | ICD-10-CM | POA: Diagnosis not present

## 2017-01-29 DIAGNOSIS — Z8669 Personal history of other diseases of the nervous system and sense organs: Secondary | ICD-10-CM | POA: Diagnosis not present

## 2017-02-24 DIAGNOSIS — Z1211 Encounter for screening for malignant neoplasm of colon: Secondary | ICD-10-CM | POA: Diagnosis not present

## 2017-02-24 DIAGNOSIS — N529 Male erectile dysfunction, unspecified: Secondary | ICD-10-CM | POA: Diagnosis not present

## 2017-02-24 DIAGNOSIS — Z720 Tobacco use: Secondary | ICD-10-CM | POA: Diagnosis not present

## 2017-02-24 DIAGNOSIS — J449 Chronic obstructive pulmonary disease, unspecified: Secondary | ICD-10-CM | POA: Diagnosis not present

## 2017-02-24 DIAGNOSIS — Z86010 Personal history of colonic polyps: Secondary | ICD-10-CM | POA: Diagnosis not present

## 2017-02-24 DIAGNOSIS — Z87438 Personal history of other diseases of male genital organs: Secondary | ICD-10-CM | POA: Diagnosis not present

## 2017-02-24 DIAGNOSIS — Z13820 Encounter for screening for osteoporosis: Secondary | ICD-10-CM | POA: Diagnosis not present

## 2017-02-24 DIAGNOSIS — G20 Parkinson's disease: Secondary | ICD-10-CM | POA: Diagnosis not present

## 2017-03-03 DIAGNOSIS — F17210 Nicotine dependence, cigarettes, uncomplicated: Secondary | ICD-10-CM | POA: Diagnosis not present

## 2017-03-03 DIAGNOSIS — M6281 Muscle weakness (generalized): Secondary | ICD-10-CM | POA: Diagnosis not present

## 2017-03-03 DIAGNOSIS — G20 Parkinson's disease: Secondary | ICD-10-CM | POA: Diagnosis not present

## 2017-03-03 DIAGNOSIS — J432 Centrilobular emphysema: Secondary | ICD-10-CM | POA: Diagnosis not present

## 2017-03-03 DIAGNOSIS — R938 Abnormal findings on diagnostic imaging of other specified body structures: Secondary | ICD-10-CM | POA: Diagnosis not present

## 2017-03-03 DIAGNOSIS — R0602 Shortness of breath: Secondary | ICD-10-CM | POA: Diagnosis not present

## 2017-03-03 DIAGNOSIS — R0609 Other forms of dyspnea: Secondary | ICD-10-CM | POA: Diagnosis not present

## 2017-03-03 DIAGNOSIS — E669 Obesity, unspecified: Secondary | ICD-10-CM | POA: Diagnosis not present

## 2017-03-03 DIAGNOSIS — J42 Unspecified chronic bronchitis: Secondary | ICD-10-CM | POA: Diagnosis not present

## 2017-03-03 DIAGNOSIS — G4733 Obstructive sleep apnea (adult) (pediatric): Secondary | ICD-10-CM | POA: Diagnosis not present

## 2017-03-18 DIAGNOSIS — Z720 Tobacco use: Secondary | ICD-10-CM | POA: Diagnosis not present

## 2017-03-18 DIAGNOSIS — D123 Benign neoplasm of transverse colon: Secondary | ICD-10-CM | POA: Diagnosis not present

## 2017-03-18 DIAGNOSIS — G20 Parkinson's disease: Secondary | ICD-10-CM | POA: Diagnosis not present

## 2017-03-18 DIAGNOSIS — E669 Obesity, unspecified: Secondary | ICD-10-CM | POA: Diagnosis not present

## 2017-03-18 DIAGNOSIS — K5730 Diverticulosis of large intestine without perforation or abscess without bleeding: Secondary | ICD-10-CM | POA: Diagnosis not present

## 2017-03-18 DIAGNOSIS — Z8669 Personal history of other diseases of the nervous system and sense organs: Secondary | ICD-10-CM | POA: Diagnosis not present

## 2017-03-18 DIAGNOSIS — Z01818 Encounter for other preprocedural examination: Secondary | ICD-10-CM | POA: Diagnosis not present

## 2017-03-18 DIAGNOSIS — Z1211 Encounter for screening for malignant neoplasm of colon: Secondary | ICD-10-CM | POA: Diagnosis not present

## 2017-03-18 DIAGNOSIS — K635 Polyp of colon: Secondary | ICD-10-CM | POA: Diagnosis not present

## 2017-03-18 DIAGNOSIS — Z86010 Personal history of colonic polyps: Secondary | ICD-10-CM | POA: Diagnosis not present

## 2017-03-24 DIAGNOSIS — F17210 Nicotine dependence, cigarettes, uncomplicated: Secondary | ICD-10-CM | POA: Diagnosis not present

## 2017-03-24 DIAGNOSIS — G4733 Obstructive sleep apnea (adult) (pediatric): Secondary | ICD-10-CM | POA: Diagnosis not present

## 2017-03-24 DIAGNOSIS — R938 Abnormal findings on diagnostic imaging of other specified body structures: Secondary | ICD-10-CM | POA: Diagnosis not present

## 2017-03-24 DIAGNOSIS — E669 Obesity, unspecified: Secondary | ICD-10-CM | POA: Diagnosis not present

## 2017-03-24 DIAGNOSIS — J42 Unspecified chronic bronchitis: Secondary | ICD-10-CM | POA: Diagnosis not present

## 2017-03-24 DIAGNOSIS — G20 Parkinson's disease: Secondary | ICD-10-CM | POA: Diagnosis not present

## 2017-03-24 DIAGNOSIS — J432 Centrilobular emphysema: Secondary | ICD-10-CM | POA: Diagnosis not present

## 2017-03-24 DIAGNOSIS — R0609 Other forms of dyspnea: Secondary | ICD-10-CM | POA: Diagnosis not present

## 2017-03-25 DIAGNOSIS — M8588 Other specified disorders of bone density and structure, other site: Secondary | ICD-10-CM | POA: Diagnosis not present

## 2017-03-25 DIAGNOSIS — Z13820 Encounter for screening for osteoporosis: Secondary | ICD-10-CM | POA: Diagnosis not present

## 2017-04-10 DIAGNOSIS — F17210 Nicotine dependence, cigarettes, uncomplicated: Secondary | ICD-10-CM | POA: Diagnosis not present

## 2017-04-10 DIAGNOSIS — J42 Unspecified chronic bronchitis: Secondary | ICD-10-CM | POA: Diagnosis not present

## 2017-04-10 DIAGNOSIS — G4733 Obstructive sleep apnea (adult) (pediatric): Secondary | ICD-10-CM | POA: Diagnosis not present

## 2017-04-10 DIAGNOSIS — I361 Nonrheumatic tricuspid (valve) insufficiency: Secondary | ICD-10-CM | POA: Diagnosis not present

## 2017-04-10 DIAGNOSIS — J432 Centrilobular emphysema: Secondary | ICD-10-CM | POA: Diagnosis not present

## 2017-04-10 DIAGNOSIS — G20 Parkinson's disease: Secondary | ICD-10-CM | POA: Diagnosis not present

## 2017-04-10 DIAGNOSIS — E669 Obesity, unspecified: Secondary | ICD-10-CM | POA: Diagnosis not present

## 2017-04-10 DIAGNOSIS — R0609 Other forms of dyspnea: Secondary | ICD-10-CM | POA: Diagnosis not present

## 2017-04-10 DIAGNOSIS — R0602 Shortness of breath: Secondary | ICD-10-CM | POA: Diagnosis not present

## 2017-04-10 DIAGNOSIS — I340 Nonrheumatic mitral (valve) insufficiency: Secondary | ICD-10-CM | POA: Diagnosis not present

## 2017-04-10 DIAGNOSIS — R938 Abnormal findings on diagnostic imaging of other specified body structures: Secondary | ICD-10-CM | POA: Diagnosis not present

## 2017-04-21 DIAGNOSIS — D229 Melanocytic nevi, unspecified: Secondary | ICD-10-CM | POA: Diagnosis not present

## 2017-04-21 DIAGNOSIS — L304 Erythema intertrigo: Secondary | ICD-10-CM | POA: Diagnosis not present

## 2017-04-21 DIAGNOSIS — L814 Other melanin hyperpigmentation: Secondary | ICD-10-CM | POA: Diagnosis not present

## 2017-04-21 DIAGNOSIS — L821 Other seborrheic keratosis: Secondary | ICD-10-CM | POA: Diagnosis not present

## 2017-05-07 DIAGNOSIS — B009 Herpesviral infection, unspecified: Secondary | ICD-10-CM | POA: Diagnosis not present

## 2017-05-07 DIAGNOSIS — L304 Erythema intertrigo: Secondary | ICD-10-CM | POA: Diagnosis not present

## 2017-05-21 DIAGNOSIS — D485 Neoplasm of uncertain behavior of skin: Secondary | ICD-10-CM | POA: Diagnosis not present

## 2017-05-21 DIAGNOSIS — L820 Inflamed seborrheic keratosis: Secondary | ICD-10-CM | POA: Diagnosis not present

## 2017-06-03 DIAGNOSIS — G20 Parkinson's disease: Secondary | ICD-10-CM | POA: Diagnosis not present

## 2017-08-04 DIAGNOSIS — J42 Unspecified chronic bronchitis: Secondary | ICD-10-CM | POA: Diagnosis not present

## 2017-08-04 DIAGNOSIS — R0609 Other forms of dyspnea: Secondary | ICD-10-CM | POA: Diagnosis not present

## 2017-08-04 DIAGNOSIS — F17210 Nicotine dependence, cigarettes, uncomplicated: Secondary | ICD-10-CM | POA: Diagnosis not present

## 2017-08-04 DIAGNOSIS — E669 Obesity, unspecified: Secondary | ICD-10-CM | POA: Diagnosis not present

## 2017-08-04 DIAGNOSIS — G20 Parkinson's disease: Secondary | ICD-10-CM | POA: Diagnosis not present

## 2017-08-04 DIAGNOSIS — R9389 Abnormal findings on diagnostic imaging of other specified body structures: Secondary | ICD-10-CM | POA: Diagnosis not present

## 2017-08-04 DIAGNOSIS — J432 Centrilobular emphysema: Secondary | ICD-10-CM | POA: Diagnosis not present

## 2017-08-04 DIAGNOSIS — G4733 Obstructive sleep apnea (adult) (pediatric): Secondary | ICD-10-CM | POA: Diagnosis not present

## 2017-08-17 DIAGNOSIS — Z23 Encounter for immunization: Secondary | ICD-10-CM | POA: Diagnosis not present

## 2017-08-25 DIAGNOSIS — Z87438 Personal history of other diseases of male genital organs: Secondary | ICD-10-CM | POA: Diagnosis not present

## 2017-08-25 DIAGNOSIS — J449 Chronic obstructive pulmonary disease, unspecified: Secondary | ICD-10-CM | POA: Diagnosis not present

## 2017-08-25 DIAGNOSIS — G20 Parkinson's disease: Secondary | ICD-10-CM | POA: Diagnosis not present

## 2017-08-25 DIAGNOSIS — Z86010 Personal history of colonic polyps: Secondary | ICD-10-CM | POA: Diagnosis not present

## 2017-08-25 DIAGNOSIS — Z720 Tobacco use: Secondary | ICD-10-CM | POA: Diagnosis not present

## 2017-08-25 DIAGNOSIS — M8580 Other specified disorders of bone density and structure, unspecified site: Secondary | ICD-10-CM | POA: Diagnosis not present

## 2017-08-25 DIAGNOSIS — N529 Male erectile dysfunction, unspecified: Secondary | ICD-10-CM | POA: Diagnosis not present

## 2017-09-15 DIAGNOSIS — G20 Parkinson's disease: Secondary | ICD-10-CM | POA: Diagnosis not present

## 2017-09-15 DIAGNOSIS — N401 Enlarged prostate with lower urinary tract symptoms: Secondary | ICD-10-CM | POA: Diagnosis not present

## 2017-09-15 DIAGNOSIS — E291 Testicular hypofunction: Secondary | ICD-10-CM | POA: Diagnosis not present

## 2017-09-15 DIAGNOSIS — R972 Elevated prostate specific antigen [PSA]: Secondary | ICD-10-CM | POA: Diagnosis not present

## 2017-09-15 DIAGNOSIS — R3915 Urgency of urination: Secondary | ICD-10-CM | POA: Diagnosis not present

## 2017-09-15 DIAGNOSIS — N528 Other male erectile dysfunction: Secondary | ICD-10-CM | POA: Diagnosis not present

## 2020-04-12 DEATH — deceased
